# Patient Record
Sex: Female | Born: 1975 | Race: White | Hispanic: No | Smoking: Former smoker
Health system: Southern US, Community
[De-identification: ages and names within clinical notes are randomized; demographics above are authoritative.]

## PROBLEM LIST (undated history)

## (undated) DIAGNOSIS — T7840XA Allergy, unspecified, initial encounter: Secondary | ICD-10-CM

## (undated) HISTORY — DX: Allergy, unspecified, initial encounter: T78.40XA

## (undated) HISTORY — PX: CHOLECYSTECTOMY: SHX55

## (undated) HISTORY — PX: GALLBLADDER SURGERY: SHX652

---

## 2000-12-10 ENCOUNTER — Emergency Department (HOSPITAL_COMMUNITY): Admission: EM | Admit: 2000-12-10 | Discharge: 2000-12-10 | Payer: Self-pay | Admitting: *Deleted

## 2001-01-25 ENCOUNTER — Encounter: Admission: RE | Admit: 2001-01-25 | Discharge: 2001-02-24 | Payer: Self-pay | Admitting: *Deleted

## 2001-06-01 ENCOUNTER — Ambulatory Visit (HOSPITAL_COMMUNITY): Admission: RE | Admit: 2001-06-01 | Discharge: 2001-06-01 | Payer: Self-pay | Admitting: *Deleted

## 2001-06-01 ENCOUNTER — Encounter: Payer: Self-pay | Admitting: *Deleted

## 2001-12-14 ENCOUNTER — Other Ambulatory Visit: Admission: RE | Admit: 2001-12-14 | Discharge: 2001-12-14 | Payer: Self-pay

## 2007-06-17 ENCOUNTER — Encounter: Payer: Self-pay | Admitting: Emergency Medicine

## 2007-06-17 ENCOUNTER — Inpatient Hospital Stay (HOSPITAL_COMMUNITY): Admission: EM | Admit: 2007-06-17 | Discharge: 2007-06-23 | Payer: Self-pay | Admitting: Emergency Medicine

## 2007-10-12 ENCOUNTER — Encounter: Admission: RE | Admit: 2007-10-12 | Discharge: 2007-10-12 | Payer: Self-pay | Admitting: Neurological Surgery
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
## 2011-04-14 LAB — BASIC METABOLIC PANEL
BUN: 15
Calcium: 8.8
Chloride: 107
Creatinine, Ser: 0.69
Creatinine, Ser: 0.75
GFR calc Af Amer: >60
GFR calc non Af Amer: >60
GFR calc non Af Amer: >60
Glucose, Bld: 110 — ABNORMAL HIGH
Potassium: 3.6

## 2011-04-14 LAB — CBC
HCT: 36.1
Hemoglobin: 12.2
MCV: 87.7
MCV: 88.6
Platelets: 276
Platelets: 359
RBC: 3.8 — ABNORMAL LOW
RDW: 12.4
WBC: 11.3 — ABNORMAL HIGH
WBC: 7.6

## 2011-04-14 LAB — DIFFERENTIAL
Basophils Absolute: 0
Basophils Relative: 0
Eosinophils Relative: 0
Lymphocytes Relative: 10 — ABNORMAL LOW
Neutro Abs: 14.7 — ABNORMAL HIGH

## 2018-05-15 ENCOUNTER — Other Ambulatory Visit: Payer: Self-pay

## 2018-05-15 ENCOUNTER — Emergency Department (HOSPITAL_COMMUNITY)
Admission: EM | Admit: 2018-05-15 | Discharge: 2018-05-15 | Disposition: A | Discharge status: Home / Self Care | Payer: BLUE CROSS/BLUE SHIELD | Attending: Emergency Medicine | Admitting: Emergency Medicine

## 2018-05-15 ENCOUNTER — Encounter (HOSPITAL_COMMUNITY): Payer: Self-pay | Admitting: Emergency Medicine

## 2018-05-15 DIAGNOSIS — Z9049 Acquired absence of other specified parts of digestive tract: Secondary | ICD-10-CM | POA: Insufficient documentation

## 2018-05-15 DIAGNOSIS — Z87891 Personal history of nicotine dependence: Secondary | ICD-10-CM | POA: Diagnosis not present

## 2018-05-15 DIAGNOSIS — R350 Frequency of micturition: Secondary | ICD-10-CM | POA: Diagnosis present

## 2018-05-15 DIAGNOSIS — N3001 Acute cystitis with hematuria: Secondary | ICD-10-CM

## 2018-05-15 LAB — URINALYSIS, ROUTINE W REFLEX MICROSCOPIC
BILIRUBIN URINE: NEGATIVE
GLUCOSE, UA: NEGATIVE mg/dL
Ketones, ur: NEGATIVE mg/dL
NITRITE: POSITIVE — AB
PROTEIN: 30 mg/dL — AB
Specific Gravity, Urine: 1.006 (ref 1.005–1.030)
pH: 6 (ref 5.0–8.0)

## 2018-05-15 LAB — PREGNANCY, URINE: PREG TEST UR: NEGATIVE

## 2018-05-15 MED ORDER — SULFAMETHOXAZOLE-TRIMETHOPRIM 800-160 MG PO TABS: 1.0000 | ORAL_TABLET | Freq: Two times a day (BID) | ORAL | 0 refills | Status: AC

## 2018-05-15 MED ORDER — SULFAMETHOXAZOLE-TRIMETHOPRIM 800-160 MG PO TABS
1.0000 | ORAL_TABLET | Freq: Once | ORAL | Status: AC
  Administered 2018-05-15: 1 via ORAL
  Filled 2018-05-15: qty 1

## 2018-05-15 NOTE — ED Provider Notes (Signed)
[facility] EMERGENCY DEPARTMENT Provider Note   CSN: [id number] Arrival date & time: 05/15/18  2053     History   Chief Complaint Chief Complaint  Patient presents with  . Urinary Tract Infection    HPI Briana [name] is a 42 y.o. female presenting with symptoms which she suspects may be an early UTI, including increased urinary frequency along with burning discomfort with radiation into her right lower back which started early this morning.  She denies fevers or chills, nausea or vomiting.  She last had a UTI 1 year ago with similar symptoms, although at that time states she also had a kidney infection.  She is possibly had blood in her urine, states is been hard to determine as she has been taking Azo today so her urine has been discolored.  She denies history of kidney stones, she denies vaginal discharge or pelvic pain.  The Azo has been partially effective at symptom relief.  The history is provided by the patient.    History reviewed. No pertinent past medical history.  There are no active problems to display for this patient.   Past Surgical History:  Procedure Laterality Date  . CHOLECYSTECTOMY       OB History   None      Home Medications    Prior to Admission medications   Medication Sig Start Date End Date Taking? Authorizing Provider  sulfamethoxazole-trimethoprim (BACTRIM DS,SEPTRA DS) 800-160 MG tablet Take 1 tablet by mouth 2 (two) times daily for 5 days. 05/15/18 05/20/18  Burgess Amor, PA-C    Family History History reviewed. No pertinent family history.  Social History Social History   Tobacco Use  . Smoking status: Former Games developer  . Smokeless tobacco: Never Used  Substance Use Topics  . Alcohol use: Not Currently  . Drug use: Not Currently     Allergies   Penicillins   Review of Systems Review of Systems  Constitutional: Negative for fever.  HENT: Negative for congestion.   Eyes: Negative.   Respiratory: Negative.     Gastrointestinal: Negative for abdominal pain, nausea and vomiting.  Genitourinary: Positive for dysuria, frequency and urgency. Negative for vaginal discharge.  Musculoskeletal: Positive for back pain. Negative for arthralgias.  Skin: Negative.   Neurological: Negative.   Psychiatric/Behavioral: Negative.      Physical Exam Updated Vital Signs BP 122/80 (BP Location: Right Arm)   Pulse 89   Temp 97.6 F (36.4 C) (Oral)   Resp 18   Ht 4\' 8"  (1.422 m)   Wt 55.3 kg   LMP 04/24/2018   SpO2 96%   BMI 27.35 kg/m   Physical Exam  Constitutional: She appears well-developed and well-nourished.  HENT:  Head: Normocephalic and atraumatic.  Eyes: Conjunctivae are normal.  Neck: Normal range of motion.  Cardiovascular: Normal rate, regular rhythm, normal heart sounds and intact distal pulses.  Pulmonary/Chest: Effort normal and breath sounds normal. She has no wheezes.  Abdominal: Soft. Bowel sounds are normal. She exhibits no distension. There is no tenderness. There is no rigidity, no rebound, no guarding and no CVA tenderness.  Musculoskeletal: Normal range of motion.  Neurological: She is alert.  Skin: Skin is warm and dry.  Psychiatric: She has a normal mood and affect.  Nursing note and vitals reviewed.    ED Treatments / Results  Labs (all labs ordered are listed, but only abnormal results are displayed) Labs Reviewed  URINALYSIS, ROUTINE W REFLEX MICROSCOPIC - Abnormal; Notable for the following components:  Result Value   Color, Urine AMBER (*)    APPearance HAZY (*)    Hgb urine dipstick LARGE (*)    Protein, ur 30 (*)    Nitrite POSITIVE (*)    Leukocytes, UA MODERATE (*)    WBC, UA >50 (*)    Bacteria, UA RARE (*)    All other components within normal limits  PREGNANCY, URINE    EKG None  Radiology No results found.  Procedures Procedures (including critical care time)  Medications Ordered in ED Medications  sulfamethoxazole-trimethoprim  (BACTRIM DS,SEPTRA DS) 800-160 MG per tablet 1 tablet (has no administration in time range)     Initial Impression / Assessment and Plan / ED Course  I have reviewed the triage vital signs and the nursing notes.  Pertinent labs & imaging results that were available during my care of the patient were reviewed by me and considered in my medical decision making (see chart for details).     Uncomplicated UTI.  Patient was placed on Bactrim with first dose given here.  She was encouraged to increase her fluid intake.  May continue Azo x48 hours for symptom relief.  Strict return precautions given including vomiting, fevers or worse pain.  Plan follow-up with PCP as needed.  Final Clinical Impressions(s) / ED Diagnoses   Final diagnoses:  Acute cystitis with hematuria    ED Discharge Orders         Ordered    sulfamethoxazole-trimethoprim (BACTRIM DS,SEPTRA DS) 800-160 MG tablet  2 times daily     05/15/18 2208           Burgess Amor, PA-C 05/15/18 2315    Vanetta Mulders, MD 05/16/18 1530

## 2018-05-15 NOTE — ED Triage Notes (Signed)
Pt c/o right flank pain, painful and increased urination since early this am, denies fever

## 2018-05-15 NOTE — Discharge Instructions (Signed)
Take your entire course of the antibiotics prescribed.  Make sure you are drinking plenty of fluids to help flush this infection.  You may continue taking your azo for symptom relief for the next 2 days.

## 2019-05-16 ENCOUNTER — Ambulatory Visit (INDEPENDENT_AMBULATORY_CARE_PROVIDER_SITE_OTHER): Payer: Managed Care, Other (non HMO) | Admitting: Family Medicine

## 2019-05-16 ENCOUNTER — Other Ambulatory Visit: Payer: Self-pay

## 2019-05-16 ENCOUNTER — Encounter: Payer: Self-pay | Admitting: Family Medicine

## 2019-05-16 VITALS — BP 120/80 | HR 78 | Temp 98.3°F | Ht <= 58 in | Wt 135.2 lb

## 2019-05-16 DIAGNOSIS — Z0000 Encounter for general adult medical examination without abnormal findings: Secondary | ICD-10-CM | POA: Diagnosis not present

## 2019-05-16 DIAGNOSIS — Z1239 Encounter for other screening for malignant neoplasm of breast: Secondary | ICD-10-CM

## 2019-05-16 LAB — CBC WITH DIFFERENTIAL/PLATELET
Absolute Monocytes: 593 cells/uL (ref 200–950)
Basophils Absolute: 41 cells/uL (ref 0–200)
Basophils Relative: 0.6 %
Eosinophils Absolute: 62 cells/uL (ref 15–500)
Eosinophils Relative: 0.9 %
HCT: 38.5 % (ref 35.0–45.0)
Hemoglobin: 12.7 g/dL (ref 11.7–15.5)
Lymphs Abs: 1973 cells/uL (ref 850–3900)
MCH: 29.5 pg (ref 27.0–33.0)
MCHC: 33 g/dL (ref 32.0–36.0)
MCV: 89.5 fL (ref 80.0–100.0)
MPV: 10.3 fL (ref 7.5–12.5)
Monocytes Relative: 8.6 %
Neutro Abs: 4230 cells/uL (ref 1500–7800)
Neutrophils Relative %: 61.3 %
Platelets: 337 10*3/uL (ref 140–400)
RBC: 4.3 10*6/uL (ref 3.80–5.10)
RDW: 12.4 % (ref 11.0–15.0)
Total Lymphocyte: 28.6 %
WBC: 6.9 10*3/uL (ref 3.8–10.8)

## 2019-05-16 LAB — COMPLETE METABOLIC PANEL WITH GFR
AG Ratio: 1.7 (calc) (ref 1.0–2.5)
ALT: 14 U/L (ref 6–29)
AST: 13 U/L (ref 10–30)
Albumin: 4 g/dL (ref 3.6–5.1)
Alkaline phosphatase (APISO): 64 U/L (ref 31–125)
BUN: 13 mg/dL (ref 7–25)
CO2: 26 mmol/L (ref 20–32)
Calcium: 8.9 mg/dL (ref 8.6–10.2)
Chloride: 105 mmol/L (ref 98–110)
Creat: 0.66 mg/dL (ref 0.50–1.10)
GFR, Est African American: 126 mL/min/{1.73_m2} (ref >=60)
GFR, Est Non African American: 109 mL/min/{1.73_m2} (ref >=60)
Globulin: 2.4 g/dL (calc) (ref 1.9–3.7)
Glucose, Bld: 91 mg/dL (ref 65–99)
Potassium: 4.4 mmol/L (ref 3.5–5.3)
Sodium: 136 mmol/L (ref 135–146)
Total Bilirubin: 0.4 mg/dL (ref 0.2–1.2)
Total Protein: 6.4 g/dL (ref 6.1–8.1)

## 2019-05-16 LAB — LIPID PANEL
Cholesterol: 180 mg/dL (ref <200)
HDL: 55 mg/dL (ref >=50)
LDL Cholesterol (Calc): 110 mg/dL (calc) — ABNORMAL HIGH
Non-HDL Cholesterol (Calc): 125 mg/dL (calc) (ref <130)
Total CHOL/HDL Ratio: 3.3 (calc) (ref <5.0)
Triglycerides: 66 mg/dL (ref <150)

## 2019-05-16 NOTE — Progress Notes (Signed)
New Patient Office Visit  Subjective:  Patient ID: Briana [name], female    DOB: [birth year]  Age: 43 y.o. MRN: [id number]  CC:  Chief Complaint  Patient presents with  . Establish Care    HPI Sala Tague Gorniak presents for  Pt trying to conceive -new partner. 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 Allergy     Past Surgical History:  Procedure Laterality Date  . CESAREAN SECTION    . CHOLECYSTECTOMY    . GALLBLADDER SURGERY      Family History  Problem Relation Age of Onset  . Stroke Mother   . COPD Mother   . Cancer Father   . Hyperlipidemia Father   . Hypertension Father     Social History   Socioeconomic History  . Marital status: Significant Other    Spouse name: Not on file  . Number of children: Not on file  . Years of education: Not on file  . Highest education level: Not on file  Occupational History  . Occupation: Radiation protection practitioner  Social Needs  . Financial resource strain: Not on file  . Food insecurity    Worry: Not on file    Inability: Not on file  . Transportation needs    Medical: Not on file    Non-medical: Not on file  Tobacco Use  . Smoking status: Former Research scientist (life sciences)  . Smokeless tobacco: Never Used  Substance and Sexual Activity  . Alcohol use: Yes    Comment: occasional  . Drug use: Not Currently  . Sexual activity: Yes  Lifestyle  . Physical activity    Days per week: Not on file    Minutes per session: Not on file  . Stress: Not on file  Relationships  . Social Herbalist on phone: Not on file    Gets together: Not on file    Attends religious service: Not on file    Active member of club or organization: Not on file    Attends meetings of clubs or organizations: Not on file    Relationship status: Not on file  . Intimate partner violence    Fear of current or ex partner: Not on file    Emotionally abused: Not on file   Physically abused: Not on file    Forced sexual activity: Not on file  Other Topics Concern  . Not on file  Social History Narrative  . Not on file    ROS Review of Systems  Constitutional: Negative.   HENT: Negative.   Respiratory: Negative.   Cardiovascular: Negative.   Gastrointestinal: Negative.   Endocrine: Negative.   Genitourinary: Negative.   Musculoskeletal: Negative.   Skin: Negative.   Allergic/Immunologic: Negative.   Neurological: Negative.   Hematological: Negative.   Psychiatric/Behavioral: Negative.     Objective:   Today's Vitals: BP 120/80 (BP Location: Right Arm, Patient Position: Sitting, Cuff Size: Normal)   Pulse 78   Temp 98.3 F (36.8 C) (Oral)   Ht 4\' 8"  (1.422 m)   Wt 135 lb 3.2 oz (61.3 kg)   SpO2 98%   BMI 30.31 kg/m   Physical Exam Constitutional:      Appearance: Normal appearance.  HENT:     Head: Normocephalic and atraumatic.     Right Ear: Tympanic membrane, ear canal and external ear normal.     Left Ear: Tympanic membrane, ear canal and  external ear normal.     Nose: Nose normal.     Mouth/Throat:     Mouth: Mucous membranes are moist.  Eyes:     Conjunctiva/sclera: Conjunctivae normal.  Neck:     Musculoskeletal: Normal range of motion and neck supple.  Cardiovascular:     Rate and Rhythm: Normal rate and regular rhythm.     Pulses: Normal pulses.     Heart sounds: Normal heart sounds.  Abdominal:     Palpations: Abdomen is soft.  Neurological:     Mental Status: She is alert and oriented to person, place, and time.  Psychiatric:        Mood and Affect: Mood normal.        Behavior: Behavior normal.     Assessment & Plan:  1. Encounter for screening breast examination - MM Digital Screening; Future  2. Wellness examination - CBC with Differential - COMPLETE METABOLIC PANEL WITH GFR - Lipid panel  Follow-up: yearly exam   Mat Carne, MD

## 2019-05-16 NOTE — Patient Instructions (Addendum)
Mammogram Fasting labwork-baseline  Eating Plan for Pregnant Women While you are pregnant, your body requires additional nutrition to help support your growing baby. You also have a higher need for some vitamins and minerals, such as folic acid, calcium, iron, and vitamin D. Eating a healthy, well-balanced diet is very important for your health and your baby's health. Your need for extra calories varies for the three 6526-month segments of your pregnancy (trimesters). For most women, it is recommended to consume:  150 extra calories a day during the first trimester.  300 extra calories a day during the second trimester.  300 extra calories a day during the third trimester. What are tips for following this plan?   Do not try to lose weight or go on a diet during pregnancy.  Limit your overall intake of foods that have "empty calories." These are foods that have little nutritional value, such as sweets, desserts, candies, and sugar-sweetened beverages.  Eat a variety of foods (especially fruits and vegetables) to get a full range of vitamins and minerals.  Take a prenatal vitamin to help meet your additional vitamin and mineral needs during pregnancy, specifically for folic acid, iron, calcium, and vitamin D.  Remember to stay active. Ask your health care provider what types of exercise and activities are safe for you.  Practice good food safety and cleanliness. Wash your hands before you eat and after you prepare raw meat. Wash all fruits and vegetables well before peeling or eating. Taking these actions can help to prevent food-borne illnesses that can be very dangerous to your baby, such as listeriosis. Ask your health care provider for more information about listeriosis. What does 150 extra calories look like? Healthy options that provide 150 extra calories each day could be any of the following:  6-8 oz (170-230 g) of plain low-fat yogurt with  cup of berries.  1 apple with 2 teaspoons  (11 g) of peanut butter.  Cut-up vegetables with  cup (60 g) of hummus.  8 oz (230 mL) or 1 cup of low-fat chocolate milk.  1 stick of string cheese with 1 medium orange.  1 peanut butter and jelly sandwich that is made with one slice of whole-wheat bread and 1 tsp (5 g) of peanut butter. For 300 extra calories, you could eat two of those healthy options each day. What is a healthy amount of weight to gain? The right amount of weight gain for you is based on your BMI before you became pregnant. If your BMI:  Was less than 18 (underweight), you should gain 28-40 lb (13-18 kg).  Was 18-24.9 (normal), you should gain 25-35 lb (11-16 kg).  Was 25-29.9 (overweight), you should gain 15-25 lb (7-11 kg).  Was 30 or greater (obese), you should gain 11-20 lb (5-9 kg). What if I am having twins or multiples? Generally, if you are carrying twins or multiples:  You may need to eat 300-600 extra calories a day.  The recommended range for total weight gain is 25-54 lb (11-25 kg), depending on your BMI before pregnancy.  Talk with your health care provider to find out about nutritional needs, weight gain, and exercise that is right for you. What foods can I eat?  Grains All grains. Choose whole grains, such as whole-wheat bread, oatmeal, or brown rice. Vegetables All vegetables. Eat a variety of colors and types of vegetables. Remember to wash your vegetables well before peeling or eating. Fruits All fruits. Eat a variety of colors and types of  fruit. Remember to wash your fruits well before peeling or eating. Meats and other protein foods Lean meats, including chicken, [place], fish, and lean cuts of beef, veal, or pork. If you eat fish or seafood, choose options that are higher in omega-3 fatty acids and lower in mercury, such as salmon, herring, mussels, trout, sardines, pollock, shrimp, crab, and lobster. Tofu. Tempeh. Beans. Eggs. Peanut butter and other nut butters. Make sure that all  meats, poultry, and eggs are cooked to food-safe temperatures or "well-done." Two or more servings of fish are recommended each week in order to get the most benefits from omega-3 fatty acids that are found in seafood. Choose fish that are lower in mercury. You can find more information online:  [employer] Dairy Pasteurized milk and milk alternatives (such as almond milk). Pasteurized yogurt and pasteurized cheese. Cottage cheese. Sour cream. Beverages Water. Juices that contain 100% fruit juice or vegetable juice. Caffeine-free teas and decaffeinated coffee. Drinks that contain caffeine are okay to drink, but it is better to avoid caffeine. Keep your total caffeine intake to less than 200 mg each day (which is 12 oz or 355 mL of coffee, tea, or soda) or the limit as told by your health care provider. Fats and oils Fats and oils are okay to include in moderation. Sweets and desserts Sweets and desserts are okay to include in moderation. Seasoning and other foods All pasteurized condiments. The items listed above may not be a complete list of recommended foods and beverages. Contact your dietitian for more options. The items listed above may not be a complete list of foods and beverages [you/your child] can eat. Contact a dietitian for more information. What foods are not recommended? Vegetables Raw (unpasteurized) vegetable juices. Fruits Unpasteurized fruit juices. Meats and other protein foods Lunch meats, bologna, hot dogs, or other deli meats. (If you must eat those meats, reheat them until they are steaming hot.) Refrigerated pat, meat spreads from a meat counter, smoked seafood that is found in the refrigerated section of a store. Raw or undercooked meats, poultry, and eggs. Raw fish, such as sushi or sashimi. Fish that have high mercury content, such as tilefish, shark, swordfish, and king mackerel. To learn more about mercury in fish, talk with your health care provider or look for  online resources, such as:  [employer] Dairy Raw (unpasteurized) milk and any foods that have raw milk in them. Soft cheeses, such as feta, queso blanco, queso fresco, Brie, Camembert cheeses, blue-veined cheeses, and Panela cheese (unless it is made with pasteurized milk, which must be stated on the label). Beverages Alcohol. Sugar-sweetened beverages, such as sodas, teas, or energy drinks. Seasoning and other foods Homemade fermented foods and drinks, such as pickles, sauerkraut, or kombucha drinks. (Store-bought pasteurized versions of these are okay.) Salads that are made in a store or deli, such as ham salad, chicken salad, egg salad, tuna salad, and seafood salad. The items listed above may not be a complete list of foods and beverages to avoid. Contact your dietitian for more information. The items listed above may not be a complete list of foods and beverages [you/your child] should avoid. Contact a dietitian for more information. Where to find more information To calculate the number of calories you need based on your height, weight, and activity level, you can use an online calculator such as:  MobileTransition.ch To calculate how much weight you should gain during pregnancy, you can use an online pregnancy weight gain calculator such as:  Streaming[employer].com[employer]cy  Summary  While you are pregnant, your body requires additional nutrition to help support your growing baby.  Eat a variety of foods, especially fruits and vegetables to get a full range of vitamins and minerals.  Practice good food safety and cleanliness. Wash your hands before you eat and after you prepare raw meat. Wash all fruits and vegetables well before peeling or eating. Taking these actions can help to prevent food-borne illnesses, such as listeriosis, that can be very dangerous to your baby.  Do not eat raw meat or fish. Do not eat fish that have high mercury  content, such as tilefish, shark, swordfish, and king mackerel. Do not eat unpasteurized (raw) dairy.  Take a prenatal vitamin to help meet your additional vitamin and mineral needs during pregnancy, specifically for folic acid, iron, calcium, and vitamin D. This information is not intended to replace advice given to you by your health care provider. Make sure you discuss any questions you have with your health care provider. Document Released: 04/07/2014 Document Revised: 10/14/2018 Document Reviewed: 03/20/2017 Elsevier Patient Education  2020 [employer].

## 2020-05-17 ENCOUNTER — Ambulatory Visit: Payer: Self-pay | Admitting: Nurse Practitioner

## 2020-05-17 NOTE — Progress Notes (Deleted)
     Subjective:  Patient ID: Briana [name], female    DOB: 22-Nov-1975  Age: 44 y.o. MRN: [id number]  CC: No chief complaint on file.     HPI  Pt is here today to establish care. Transferring care from Dr. Judee Clara Last physical and labs were completed 05/16/2019.    Past Medical History:  Diagnosis Date  . Allergy       Family History  Problem Relation Age of Onset  . Stroke Mother   . COPD Mother   . Cancer Father   . Hyperlipidemia Father   . Hypertension Father     Social History   Social History Narrative  . Not on file   Social History   Tobacco Use  . Smoking status: Former Games developer  . Smokeless tobacco: Never Used  Substance Use Topics  . Alcohol use: Yes    Comment: occasional     No outpatient medications have been marked as taking for the 05/17/20 encounter (Appointment) with Heather Roberts, NP.    ROS:  ROS   Objective:   Today's Vitals: There were no vitals taken for this visit. Vitals with BMI 05/16/2019 05/15/2018  Height 4\' 8"  4\' 8"   Weight 135 lbs 3 oz 122 lbs  BMI 30.33 27.37  Systolic 120 122  Diastolic 80 80  Pulse 78 89     Physical Exam       Assessment and Plan   No diagnosis found.   Plan:    Tests ordered No orders of the defined types were placed in this encounter.     No orders of the defined types were placed in this encounter.   Patient to follow-up in ***  , NP
